# Patient Record
Sex: Female | Born: 1955 | Race: White | Hispanic: No | Marital: Married | Smoking: Never smoker
Health system: Southern US, Community
[De-identification: ages and names within clinical notes are randomized; demographics above are authoritative.]

## PROBLEM LIST (undated history)

## (undated) DIAGNOSIS — N809 Endometriosis, unspecified: Secondary | ICD-10-CM

## (undated) DIAGNOSIS — D569 Thalassemia, unspecified: Secondary | ICD-10-CM

## (undated) DIAGNOSIS — K589 Irritable bowel syndrome without diarrhea: Secondary | ICD-10-CM

## (undated) DIAGNOSIS — M797 Fibromyalgia: Secondary | ICD-10-CM

## (undated) DIAGNOSIS — G9050 Complex regional pain syndrome I, unspecified: Secondary | ICD-10-CM

## (undated) HISTORY — DX: Complex regional pain syndrome I, unspecified: G90.50

## (undated) HISTORY — DX: Irritable bowel syndrome, unspecified: K58.9

## (undated) HISTORY — PX: OTHER SURGICAL HISTORY: SHX169

## (undated) HISTORY — PX: VAGINAL HYSTERECTOMY: SUR661

## (undated) HISTORY — PX: CHOLECYSTECTOMY: SHX55

## (undated) HISTORY — DX: Endometriosis, unspecified: N80.9

## (undated) HISTORY — DX: Thalassemia, unspecified: D56.9

## (undated) HISTORY — DX: Fibromyalgia: M79.7

---

## 2004-07-19 ENCOUNTER — Ambulatory Visit: Payer: Self-pay | Admitting: Pain Medicine

## 2005-01-05 ENCOUNTER — Ambulatory Visit: Payer: Self-pay | Admitting: Pain Medicine

## 2005-02-24 ENCOUNTER — Ambulatory Visit: Payer: Self-pay | Admitting: Pain Medicine

## 2005-03-15 ENCOUNTER — Ambulatory Visit: Payer: Self-pay | Admitting: Physician Assistant

## 2005-04-07 ENCOUNTER — Ambulatory Visit: Payer: Self-pay | Admitting: Pain Medicine

## 2005-04-20 ENCOUNTER — Ambulatory Visit: Payer: Self-pay | Admitting: Physician Assistant

## 2005-05-04 ENCOUNTER — Ambulatory Visit: Payer: Self-pay | Admitting: Physician Assistant

## 2011-11-22 DIAGNOSIS — D563 Thalassemia minor: Secondary | ICD-10-CM | POA: Insufficient documentation

## 2013-01-09 DIAGNOSIS — Z803 Family history of malignant neoplasm of breast: Secondary | ICD-10-CM | POA: Insufficient documentation

## 2014-05-15 DIAGNOSIS — M797 Fibromyalgia: Secondary | ICD-10-CM | POA: Insufficient documentation

## 2014-05-15 DIAGNOSIS — K219 Gastro-esophageal reflux disease without esophagitis: Secondary | ICD-10-CM | POA: Insufficient documentation

## 2014-05-15 DIAGNOSIS — M8580 Other specified disorders of bone density and structure, unspecified site: Secondary | ICD-10-CM | POA: Insufficient documentation

## 2014-05-15 DIAGNOSIS — N6019 Diffuse cystic mastopathy of unspecified breast: Secondary | ICD-10-CM | POA: Insufficient documentation

## 2015-02-13 ENCOUNTER — Encounter: Payer: Self-pay | Admitting: Internal Medicine

## 2015-04-16 ENCOUNTER — Encounter: Payer: Self-pay | Admitting: Internal Medicine

## 2015-04-16 ENCOUNTER — Ambulatory Visit (INDEPENDENT_AMBULATORY_CARE_PROVIDER_SITE_OTHER): Payer: BLUE CROSS/BLUE SHIELD | Admitting: Internal Medicine

## 2015-04-16 VITALS — BP 120/72 | HR 77 | Ht 62.0 in | Wt 108.0 lb

## 2015-04-16 DIAGNOSIS — K2100 Gastro-esophageal reflux disease with esophagitis, without bleeding: Secondary | ICD-10-CM

## 2015-04-16 DIAGNOSIS — R1031 Right lower quadrant pain: Secondary | ICD-10-CM

## 2015-04-16 DIAGNOSIS — D509 Iron deficiency anemia, unspecified: Secondary | ICD-10-CM | POA: Diagnosis not present

## 2015-04-16 DIAGNOSIS — K589 Irritable bowel syndrome without diarrhea: Secondary | ICD-10-CM

## 2015-04-16 DIAGNOSIS — R1011 Right upper quadrant pain: Secondary | ICD-10-CM

## 2015-04-16 MED ORDER — NA SULFATE-K SULFATE-MG SULF 17.5-3.13-1.6 GM/177ML PO SOLN: 1.0000 | Freq: Once | ORAL | Status: DC

## 2015-04-16 MED ORDER — METOCLOPRAMIDE HCL 10 MG PO TABS: ORAL_TABLET | ORAL | Status: DC

## 2015-04-16 NOTE — Patient Instructions (Addendum)
We have sent the following medications to your pharmacy for you to pick up at your convenience:  Reglan    You have been scheduled for an endoscopy and colonoscopy. Please follow the written instructions given to you at your visit today. Please pick up your prep supplies at the pharmacy within the next 1-3 days. If you use inhalers (even only as needed), please bring them with you on the day of your procedure.   You may take Prilosec  - over the counter - one tablet before your first meal of the day

## 2015-04-16 NOTE — Progress Notes (Signed)
HISTORY OF PRESENT ILLNESS:  Renee Richmond is a 60 y.o. female who is referred today by her primary care physician Dr. Georgiann Richmond for evaluation of indigestion, abdominal pain, and worsening microscopic anemia. 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 She also has a history of fibromyalgia, irritable bowel syndrome, and endometriosis. She is status post remote cholecystectomy and vaginal hysterectomy. Her chief complaint today is that of 6 months of epigastric pain with indigestion for which she takes Tums on a regular basis. She is reluctant to take prescription medications for GERD, though has not tried them in earnest. She states "I just like to take medication". Along with regurgitation and pyrosis she mentions belching and bloatedness. Occasionally he will develop some chest discomfort with her reflux. No dysphagia. She also reports new onset right upper quadrant discomfort of several months. The discomfort is described as a strong deep pain or soreness. This has been going on for about 3 months. No exacerbating or relieving factors. She does have chronic alternating bowel habits. No bleeding. She tells me that she saw her hematologist recently and it was noted that her anemia and microcytosis was slightly worse than baseline. Her hematologist recommended GI evaluation and was aware of previous evaluations. The patient did bring multiple outside records, which after considerable time, I reviewed. Previous gastroenterologist was Dr. Aleene Richmond in [place]. 2013 she was evaluated for nausea, indigestion, and epigastric pain. EGD was performed and was said to show "distal esophageal edema; less than ideal gastric peristalsis". She was told continue Prilosec and add ranitidine at bedtime. Also, colonoscopy performed June 2013 for routine screening purposes was said to be normal throughout with complete examination good  preparation. Follow-up in 10 years recommended review of outside blood work from November 2016 reveals a hemoglobin of 10.2 with MCV 62.2. Normal comprehensive metabolic panel hemoglobin in May 2016 was 11 with similar MCV. Iron B12 and folate as well as ferritin in May 2016 were normal. Finally, CT scan of the abdomen with contrast performed June 2016 was unremarkable postcholecystectomy. Finally, the patient states that she has significant problems with nausea and vomiting with preparations such as colonoscopy preps or CT.  REVIEW OF SYSTEMS:  All non-GI ROS negative except for occasional irregular heartbeat  Past Medical History  Diagnosis Date  . Fibromyalgia   . Thalassanemia   . IBS (irritable bowel syndrome)   . Endometriosis   . RSD (reflex sympathetic dystrophy)     left hand    Past Surgical History  Procedure Laterality Date  . Vaginal hysterectomy      25 years ago  . Cholecystectomy      36 years ago  . Right rotator cuff repair      Social History Renee Richmond  reports that she has never smoked. She does not have any smokeless tobacco history on file. She reports that she does not drink alcohol or use illicit drugs.  family history includes Breast cancer in her mother; Colon cancer in her maternal grandmother; Liver cancer in her maternal grandmother; Prostate cancer in her father.  Not on File     PHYSICAL EXAMINATION: Vital signs: BP 120/72 mmHg  Pulse 77  Ht  (1.575 m)  Wt 108 lb (48.988 kg)  BMI 19.75 kg/m2  Constitutional: Pleasant, thin, generally well-appearing, no acute distress Psychiatric: alert and oriented x3, cooperative Eyes: extraocular movements intact, anicteric, conjunctiva pink Mouth: oral pharynx moist, no  lesions Neck: supple without thyromegaly Lymph: no lymphadenopathy Cardiovascular: heart regular rate and rhythm, no murmur Lungs: clear to auscultation bilaterally Abdomen: soft, nontender, nondistended, no obvious ascites,  no peritoneal signs, normal bowel sounds, no organomegaly. Reproducible right upper quadrant tenderness with palpation along the large cholecystectomy incision Rectal: Deferred until colonoscopy Extremities: no clubbing cyanosis or lower extremity edema bilaterally Skin: no lesions on visible extremities Neuro: No focal deficits. Brisk DTRs  ASSESSMENT:  #1. GERD. Not controlled with antacids on demand #2. Epigastric pain. Likely secondary to GERD #3. Right upper quadrant pain. Musculoskeletal in nature as confirmed on physical exam #4. Worsening microcytic anemia. Has a history of thalassemia minor. Likely due to the same. Hematologist concerned about potential GI blood loss. No documented iron deficiency. The patient is anxious regarding this issue which she wishes to have evaluated endoscopically #5. Colonoscopy 2013 normal #6. Difficulties with preparations on prior colonoscopy 7. IBS   PLAN:  #1. Reflux precautions. Reviewed. Literature provided #2.  I recommended omeprazole OTC 20 mg daily. I asked her to try for 2 weeks to see if this helps. We can go from there... #3. Schedule upper endoscopy to evaluate epigastric pain (rule out other causes such as PUD) and obtain duodenal biopsies to exclude celiac disease for worsening microcytic anemia and IBS-type complaints.The nature of the procedure, as well as the risks, benefits, and alternatives were carefully and thoroughly reviewed with the patient. Ample time for discussion and questions allowed. The patient understood, was satisfied, and agreed to proceed. #4. Schedule colonoscopy based on concerns raised by hematologist [facility]. I suspect this is low yield UNLESS iron deficiency identified during her [facility] evaluation. I do not have [facility] records.The nature of the procedure, as well as the risks, benefits, and alternatives were carefully and thoroughly reviewed with the patient. Ample time for discussion and questions allowed. The patient  understood, was satisfied, and agreed to proceed. #5. Adjustment in prep. Suprep. Hard candy with prep recommended. Also, prescribed metoclopramide 10 mg before each prep session #6. Discussion on right upper quadrant pain. Musculoskeletal etiology. Reassurance  A copy of this consultation and has been sent to Dr. Jonelle Sports

## 2015-04-22 ENCOUNTER — Telehealth: Payer: Self-pay

## 2015-04-22 NOTE — Telephone Encounter (Signed)
Patient to called to get clarification on her medicine.  The sheet that came from the pharmacy said that Reglan can be used to treat Renee Richmond so she wanted to make sure it was the right one.  I told her that Reglan is also used for nausea and that she had the correct medication.  Patient acknowledged and understood

## 2015-04-28 ENCOUNTER — Ambulatory Visit (AMBULATORY_SURGERY_CENTER): Payer: BLUE CROSS/BLUE SHIELD | Admitting: Internal Medicine

## 2015-04-28 ENCOUNTER — Encounter: Payer: Self-pay | Admitting: Internal Medicine

## 2015-04-28 VITALS — BP 134/74 | HR 78 | Temp 95.4°F | Resp 17 | Ht 62.0 in | Wt 108.0 lb

## 2015-04-28 DIAGNOSIS — R1011 Right upper quadrant pain: Secondary | ICD-10-CM | POA: Diagnosis not present

## 2015-04-28 DIAGNOSIS — K589 Irritable bowel syndrome without diarrhea: Secondary | ICD-10-CM | POA: Diagnosis not present

## 2015-04-28 DIAGNOSIS — D509 Iron deficiency anemia, unspecified: Secondary | ICD-10-CM | POA: Diagnosis not present

## 2015-04-28 DIAGNOSIS — K2100 Gastro-esophageal reflux disease with esophagitis, without bleeding: Secondary | ICD-10-CM

## 2015-04-28 MED ORDER — SODIUM CHLORIDE 0.9 % IV SOLN: 500.0000 mL | INTRAVENOUS | Status: DC

## 2015-04-28 NOTE — Op Note (Signed)
[facility] [address] [place], [address]   ENDOSCOPY PROCEDURE REPORT  PATIENT: [name], [name]  MR#: [id number] BIRTHDATE: 02/12/1956 , 59  yrs. old GENDER: female ENDOSCOPIST: Roxy Cedar, MD REFERRED BY:  Eldridge Abrahams, [name] PROCEDURE DATE:  04/28/2015 PROCEDURE:  EGD w/ biopsy ASA CLASS:     Class II INDICATIONS:  abdominal pain and history of esophageal reflux. MEDICATIONS: Monitored anesthesia care and Propofol 100 mg IV TOPICAL ANESTHETIC: none  DESCRIPTION OF PROCEDURE: After the risks benefits and alternatives of the procedure were thoroughly explained, informed consent was obtained.  The LB [id number] endoscope was introduced through the mouth and advanced to the second portion of the duodenum , Without limitations.  The instrument was slowly withdrawn as the mucosa was fully examined.  EXAM:The esophagus revealed a large-caliber ringlike distal esophageal stricture at the gastroesophageal junction without significant inflammation or Barrett's esophagus.stomach was normal. The duodenum was normal.  Duodenal biopsies obtained to rule out sprue.  Retroflexed views revealed a hiatal hernia.     The scope was then withdrawn from the patient and the procedure completed.  COMPLICATIONS: There were no immediate complications.  ENDOSCOPIC IMPRESSION: 1. Large caliber distal esophageal stricture. Otherwise normal EGD 2. GERD 3. Microcytic anemia. Known thalassemia  RECOMMENDATIONS: 1.  Anti-reflux regimen to be followed 2.  Continue PPI (Prilosec OTC 20 mg daily) to help control your acid reflux symptoms 3.  Await biopsy results . Dr. Marina Goodell will send you a letter 4. Return to the care of your primary physician  REPEAT EXAM:  eSigned:  Roxy Cedar, MD 04/28/2015 2:50 PM    CC:The Patient  ; [name], [name]

## 2015-04-28 NOTE — Patient Instructions (Signed)
HANDOUTS GIVEN : GERD AND DIVERTICULOSIS.   YOU HAD AN ENDOSCOPIC PROCEDURE TODAY AT THE [facility]:   Refer to the procedure report that was given to you for any specific questions about what was found during the examination.  If the procedure report does not answer your questions, please call your gastroenterologist to clarify.  If you requested that your care partner not be given the details of your procedure findings, then the procedure report has been included in a sealed envelope for you to review at your convenience later.  YOU SHOULD EXPECT: Some feelings of bloating in the abdomen. Passage of more gas than usual.  Walking can help get rid of the air that was put into your GI tract during the procedure and reduce the bloating. If you had a lower endoscopy (such as a colonoscopy or flexible sigmoidoscopy) you may notice spotting of blood in your stool or on the toilet paper. If you underwent a bowel prep for your procedure, you may not have a normal bowel movement for a few days.  Please Note:  You might notice some irritation and congestion in your nose or some drainage.  This is from the oxygen used during your procedure.  There is no need for concern and it should clear up in a day or so.  SYMPTOMS TO REPORT IMMEDIATELY:   Following lower endoscopy (colonoscopy or flexible sigmoidoscopy):  Excessive amounts of blood in the stool  Significant tenderness or worsening of abdominal pains  Swelling of the abdomen that is new, acute  Fever of 100F or higher   Following upper endoscopy (EGD)  Vomiting of blood or coffee ground material  New chest pain or pain under the shoulder blades  Painful or persistently difficult swallowing  New shortness of breath  Fever of 100F or higher  Black, tarry-looking stools  For urgent or emergent issues, a gastroenterologist can be reached at any hour by calling [phone].   DIET: Your first meal following the procedure should  be a small meal and then it is ok to progress to your normal diet. Heavy or fried foods are harder to digest and may make you feel nauseous or bloated.  Likewise, meals heavy in dairy and vegetables can increase bloating.  Drink plenty of fluids but you should avoid alcoholic beverages for 24 hours.  ACTIVITY:  You should plan to take it easy for the rest of today and you should NOT DRIVE or use heavy machinery until tomorrow (because of the sedation medicines used during the test).    FOLLOW UP: Our staff will call the number listed on your records the next business day following your procedure to check on you and address any questions or concerns that you may have regarding the information given to you following your procedure. If we do not reach you, we will leave a message.  However, if you are feeling well and you are not experiencing any problems, there is no need to return our call.  We will assume that you have returned to your regular daily activities without incident.  If any biopsies were taken you will be contacted by phone or by letter within the next 1-3 weeks.  Please call us at [phone] if you have not heard about the biopsies in 3 weeks.    SIGNATURES/CONFIDENTIALITY: You and/or your care partner have signed paperwork which will be entered into your electronic medical record.  These signatures attest to the fact that that the information above  on your After Visit Summary has been reviewed and is understood.  Full responsibility of the confidentiality of this discharge information lies with you and/or your care-partner.

## 2015-04-28 NOTE — Progress Notes (Signed)
Called to room to assist during endoscopic procedure.  Patient ID and intended procedure confirmed with present staff. Received instructions for my participation in the procedure from the performing physician.  

## 2015-04-28 NOTE — Progress Notes (Signed)
To recovery, report to Tyrell, RN, VSS. 

## 2015-04-28 NOTE — Op Note (Signed)
[facility] [address] [place], [address]   COLONOSCOPY PROCEDURE REPORT  PATIENT: Renee Richmond, Renee Richmond  MR#: [id number] BIRTHDATE: 1956-01-27 , 59  yrs. old GENDER: female ENDOSCOPIST: Roxy Cedar, MD REFERRED AO:[id number] Emelda Brothers, [name] PROCEDURE DATE:  04/28/2015 PROCEDURE:   Colonoscopy, diagnostic First Screening Colonoscopy - Avg.  risk and is 50 yrs.  old or older - No.  Prior Negative Screening - Now for repeat screening. N/A  History of Adenoma - Now for follow-up colonoscopy & has been > or = to 3 yrs.  N/A  Polyps removed today? No Recommend repeat exam, <10 yrs? No ASA CLASS:   Class II INDICATIONS:microcytic anemia, worsening from baseline (known thalassemia minor). Colonoscopy elsewhere 2013 was negative. MEDICATIONS: Monitored anesthesia care and Propofol 200 mg IV  DESCRIPTION OF PROCEDURE:   After the risks benefits and alternatives of the procedure were thoroughly explained, informed consent was obtained.  The digital rectal exam revealed no abnormalities of the rectum.   The LB [id number]  endoscope was introduced through the anus and advanced to the cecum, which was identified by both the appendix and ileocecal valve. No adverse events experienced.   The quality of the prep was excellent. (Suprep was used)  The instrument was then slowly withdrawn as the colon was fully examined. Estimated blood loss is zero unless otherwise noted in this procedure report.      COLON FINDINGS: There was mild diverticulosis noted in the sigmoid colon.   The examination was otherwise normal.  Retroflexed views revealed no abnormalities. The time to cecum = 3.3 Withdrawal time = 8.3   The scope was withdrawn and the procedure completed. COMPLICATIONS: There were no immediate complications.  ENDOSCOPIC IMPRESSION: 1.   Mild diverticulosis was noted in the sigmoid colon 2.   The examination was otherwise normal  RECOMMENDATIONS: 1.  Continue  current colorectal screening recommendations for "routine risk" patients with a repeat colonoscopy in 10 years. 2.  Upper endoscopy today (please see report)  eSigned:  Roxy Cedar, MD 04/28/2015 2:45 PM   cc: The Patient    ;Reuel Boom Pomposini,MD

## 2015-04-29 ENCOUNTER — Telehealth: Payer: Self-pay | Admitting: Emergency Medicine

## 2015-04-29 NOTE — Telephone Encounter (Signed)
  Follow up Call-  Call back number 04/28/2015  Post procedure Call Back phone  # [phone]  Permission to leave phone message Yes     Patient questions:  Do you have a fever, pain , or abdominal swelling? No. Pain Score  0 *  Have you tolerated food without any problems? Yes.    Have you been able to return to your normal activities? Yes.    Do you have any questions about your discharge instructions: Diet   No. Medications  No. Follow up visit  No.  Do you have questions or concerns about your Care? No.  Actions: * If pain score is 4 or above: No action needed, pain <4.

## 2015-05-05 ENCOUNTER — Encounter: Payer: Self-pay | Admitting: Internal Medicine

## 2019-04-15 ENCOUNTER — Encounter (INDEPENDENT_AMBULATORY_CARE_PROVIDER_SITE_OTHER): Payer: Self-pay

## 2019-04-15 ENCOUNTER — Ambulatory Visit (INDEPENDENT_AMBULATORY_CARE_PROVIDER_SITE_OTHER): Payer: BC Managed Care – PPO | Admitting: Internal Medicine

## 2019-04-15 ENCOUNTER — Other Ambulatory Visit: Payer: Self-pay

## 2019-04-15 ENCOUNTER — Other Ambulatory Visit: Payer: Self-pay | Admitting: Orthopedic Surgery

## 2019-04-15 ENCOUNTER — Encounter: Payer: Self-pay | Admitting: Internal Medicine

## 2019-04-15 VITALS — BP 124/66 | HR 64 | Temp 98.5°F | Ht 63.0 in | Wt 109.0 lb

## 2019-04-15 DIAGNOSIS — M797 Fibromyalgia: Secondary | ICD-10-CM

## 2019-04-15 DIAGNOSIS — R1011 Right upper quadrant pain: Secondary | ICD-10-CM | POA: Diagnosis not present

## 2019-04-15 DIAGNOSIS — K219 Gastro-esophageal reflux disease without esophagitis: Secondary | ICD-10-CM | POA: Diagnosis not present

## 2019-04-15 DIAGNOSIS — K222 Esophageal obstruction: Secondary | ICD-10-CM | POA: Diagnosis not present

## 2019-04-15 DIAGNOSIS — R1013 Epigastric pain: Secondary | ICD-10-CM | POA: Diagnosis not present

## 2019-04-15 DIAGNOSIS — K5730 Diverticulosis of large intestine without perforation or abscess without bleeding: Secondary | ICD-10-CM

## 2019-04-15 DIAGNOSIS — M25511 Pain in right shoulder: Secondary | ICD-10-CM

## 2019-04-15 MED ORDER — SUCRALFATE 1 GM/10ML PO SUSP: 1.0000 g | Freq: Two times a day (BID) | ORAL | 3 refills | Status: DC

## 2019-04-15 NOTE — Patient Instructions (Signed)
If you are age 66 or older, your body mass index should be between 23-30. Your Body mass index is 19.31 kg/m. If this is out of the aforementioned range listed, please consider follow up with your Primary Care Provider.  If you are age 108 or younger, your body mass index should be between 19-25. Your Body mass index is 19.31 kg/m. If this is out of the aformentioned range listed, please consider follow up with your Primary Care Provider.    We have sent the following medications to your pharmacy for you to pick up at your convenience:  Carafate  You have been scheduled for an abdominal ultrasound at [facility] (1st floor of hospital) on 04/18/2019 at 9:00am. Please arrive 15 minutes prior to your appointment for registration. Make certain not to have anything to eat or drink after midnight prior to your appointment. Should you need to reschedule your appointment, please contact radiology at [phone]. This test typically takes about 30 minutes to perform.

## 2019-04-15 NOTE — Progress Notes (Signed)
HISTORY OF PRESENT ILLNESS:  Renee Richmond is a 64 y.o. female, from [place], who was initially evaluated in this office April 16, 2015 with a myriad of complaints.  See that dictation for details.  She subsequently underwent colonoscopy and upper endoscopy April 28, 2015.  Colonoscopy revealed mild sigmoid diverticulosis but was otherwise normal.  Upper endoscopy revealed an incidental large caliber esophageal stricture but was otherwise normal.  Her history is remarkable for irritable bowel syndrome, GERD, and fibromyalgia.  She is status post remote cholecystectomy.  For her GERD she continues on omeprazole 20 mg daily.  For the most part this controls reflux symptoms.  Her chief complaint today is that of epigastric discomfort or fullness.  She states that "it feels like something is there".  As well, right upper quadrant pain.  Similar to which she had 4 years ago.  She feels that the symptoms are worse after having had Covid last November.  She also complains of increased gas and flatus.  Her bowel habits continue to alternate.  No bleeding.  No weight loss.  No fevers.  REVIEW OF SYSTEMS:  All non-GI ROS negative unless otherwise stated in the HPI except for irregular heartbeat  Past Medical History:  Diagnosis Date  . Endometriosis   . Fibromyalgia   . IBS (irritable bowel syndrome)   . RSD (reflex sympathetic dystrophy)    left hand  . Thalassanemia     Past Surgical History:  Procedure Laterality Date  . CHOLECYSTECTOMY     36 years ago  . right rotator cuff repair    . VAGINAL HYSTERECTOMY     25 years ago    Social History [name]  reports that she has never smoked. She has never used smokeless tobacco. She reports that she does not drink alcohol or use drugs.  family history includes Breast cancer in her mother; Colon cancer in her maternal grandmother; Liver cancer in her maternal grandmother; Prostate cancer in her father.  Allergies  Allergen  Reactions  . Codeine Nausea And Vomiting  . Darvon  [Propoxyphene] Nausea And Vomiting    DARVOCET. DARVOCET       PHYSICAL EXAMINATION: Vital signs: BP 124/66   Pulse 64   Temp 98.5 F (36.9 C)   Ht 5' 3"  (1.6 m)   Wt 109 lb (49.4 kg)   BMI 19.31 kg/m   Constitutional: generally well-appearing, no acute distress Psychiatric: alert and oriented x3, cooperative Eyes: extraocular movements intact, anicteric, conjunctiva pink Mouth: oral pharynx moist, no lesions Neck: supple no lymphadenopathy Cardiovascular: heart regular rate and rhythm, no murmur Lungs: clear to auscultation bilaterally Abdomen: soft, nontender, nondistended, no obvious ascites, no peritoneal signs, normal bowel sounds, no organomegaly Rectal: Omitted Extremities: no clubbing, cyanosis, or lower extremity edema bilaterally Skin: no lesions on visible extremities Neuro: No focal deficits.  Cranial nerves intact  ASSESSMENT:  1.  Vague epigastric discomfort.  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  GERD.  On PPI.  No classic symptoms 3.  Right upper quadrant pain.  Chronic and recurrent.  Muscle wall tenderness on exam 4.  Fibromyalgia 5.  Prior colonoscopy and upper endoscopy 2017 as described 6.  Status post cholecystectomy remotely   PLAN:  1.  Reflux precautions 2.  Continue omeprazole 3.  Prescribe Carafate slurry twice daily.  Medication side effects reviewed 4.  Schedule abdominal ultrasound to evaluate epigastric discomfort.  We will contact the patient with results when available 5.  Follow-up screening colonoscopy around 2027 6.  Routine GI follow-up as needed A total time of 45 minutes was spent preparing to see the patient, reviewing relevant data, obtaining history, performing comprehensive physical exam, counseling the patient regarding her above listed issues, ordering medications and advanced radiology examination, and documenting the clinical information in the health record

## 2019-04-18 ENCOUNTER — Other Ambulatory Visit: Payer: Self-pay

## 2019-04-18 ENCOUNTER — Ambulatory Visit (HOSPITAL_COMMUNITY)
Admission: RE | Admit: 2019-04-18 | Discharge: 2019-04-18 | Disposition: A | Discharge status: Home / Self Care | Payer: BC Managed Care – PPO | Source: Ambulatory Visit | Attending: Internal Medicine | Admitting: Internal Medicine

## 2019-04-18 DIAGNOSIS — R1013 Epigastric pain: Secondary | ICD-10-CM | POA: Insufficient documentation

## 2019-04-18 DIAGNOSIS — R1011 Right upper quadrant pain: Secondary | ICD-10-CM | POA: Insufficient documentation

## 2019-05-04 ENCOUNTER — Ambulatory Visit
Admission: RE | Admit: 2019-05-04 | Discharge: 2019-05-04 | Disposition: A | Discharge status: Home / Self Care | Payer: BC Managed Care – PPO | Source: Ambulatory Visit | Attending: Orthopedic Surgery | Admitting: Orthopedic Surgery

## 2019-05-04 DIAGNOSIS — M25511 Pain in right shoulder: Secondary | ICD-10-CM

## 2019-06-18 ENCOUNTER — Telehealth: Payer: Self-pay | Admitting: Internal Medicine

## 2019-06-24 ENCOUNTER — Telehealth: Payer: Self-pay

## 2019-06-24 MED ORDER — SUCRALFATE 1 GM/10ML PO SUSP: 1.0000 g | Freq: Two times a day (BID) | ORAL | 3 refills | Status: DC

## 2019-06-24 NOTE — Telephone Encounter (Signed)
Sucralfate approved by [employer] through 06/18/2020.

## 2019-06-24 NOTE — Telephone Encounter (Signed)
Prior authorization for [employer] approved through Cover My  Meds

## 2019-07-22 DIAGNOSIS — M7989 Other specified soft tissue disorders: Secondary | ICD-10-CM | POA: Insufficient documentation

## 2019-07-22 DIAGNOSIS — L942 Calcinosis cutis: Secondary | ICD-10-CM | POA: Insufficient documentation

## 2020-01-27 ENCOUNTER — Telehealth: Payer: Self-pay | Admitting: Internal Medicine

## 2020-01-27 NOTE — Telephone Encounter (Signed)
Patient called states she is having upper  abdominal issues and is hard for to eat is seeking advise

## 2020-01-27 NOTE — Telephone Encounter (Signed)
Pt states that the upper part of her stomach has been "giving her a fit."Reports she has been having abdominal pain along with diarrhea and nausea. Pt requesting to be seen. Pt scheduled to see Hyacinth Meeker PA 01/30/20 at 8:30am. Pt aware of appt.

## 2020-01-30 ENCOUNTER — Encounter: Payer: Self-pay | Admitting: Physician Assistant

## 2020-01-30 ENCOUNTER — Ambulatory Visit (INDEPENDENT_AMBULATORY_CARE_PROVIDER_SITE_OTHER): Payer: Medicare Other | Admitting: Physician Assistant

## 2020-01-30 VITALS — BP 114/58 | HR 72 | Ht 63.0 in | Wt 108.1 lb

## 2020-01-30 DIAGNOSIS — R1013 Epigastric pain: Secondary | ICD-10-CM

## 2020-01-30 DIAGNOSIS — R110 Nausea: Secondary | ICD-10-CM | POA: Diagnosis not present

## 2020-01-30 DIAGNOSIS — K219 Gastro-esophageal reflux disease without esophagitis: Secondary | ICD-10-CM | POA: Diagnosis not present

## 2020-01-30 MED ORDER — PANTOPRAZOLE SODIUM 40 MG PO TBEC: 40.0000 mg | DELAYED_RELEASE_TABLET | Freq: Two times a day (BID) | ORAL | 3 refills | Status: DC

## 2020-01-30 NOTE — Progress Notes (Signed)
Noted  

## 2020-01-30 NOTE — Progress Notes (Signed)
Chief Complaint: Upper abdominal pain  HPI:    Renee Richmond is a 64 year old female with past medical history as listed below including IBS and fibromyalgia known to Dr. Henrene Pastor, who was referred to me by Dairl Ponder, MD for a complaint of upper abdominal pain.      04/28/2015 EGD with large caliber distal esophageal stricture, GERD.  Same-day colonoscopy with mild sigmoid diverticulosis and otherwise normal.  Repeat recommended in 10 years.    04/15/2019 patient seen in clinic by Dr. Henrene Pastor for a myriad of complaints.  She was complaining of epigastric discomfort and fullness at that time.  She was continued on omeprazole and given Carafate slurry twice daily also ordered abdominal ultrasound as below.    04/18/2019 ultrasound of the abdomen with status post cholecystectomy, no biliary ductal dilation, no acute findings, aortic atherosclerosis.    01/27/2020 patient seen in the ED for epigastric pain.  Abdominal x-ray showed nonobstructive bowel gas pattern no acute cardiopulmonary process.  Labs showed hemoglobin minimally decreased at 11.2, MCV low at 66 (known thalassemia), potassium minimally decreased at 3.4 otherwise normal CBC and CMP.    Today, the patient describes that she has been to the ER twice for this epigastric pain.  Tells me that she has really battled with it ever since she had Covid in November of last year, in fact that is why she was seen in clinic back in February.  Tells me that it has worsened over the past month and especially in the past 2 weeks and she has a new symptom of nausea but no vomiting.  Tells me the nausea is so bad with the epigastric pain that she did not want to eat much of anything and has lost several pounds over the past week or so.  This all got worse on Wednesday of last week, she proceeded to the ER on 2 separate occasions, the last as above.  Initially they changed her Omeprazole 20 mg to Pantoprazole 40 mg and she also restarted her Carafate which she  typically takes twice a day.  She was also using Zofran about every 6 hours until Tuesday of this week, 01/28/2020, she did not require it at all yesterday.  Symptoms seem to be slowly getting better in the past 48 hours, but patient tells me that she has a chronic cough and clears her throat a lot and does not feel like her reflux is under good control.  She would like to know what is going on.    Tells me she did start a new medication for heart palpitations, Metoprolol about 3 weeks ago, but she has discussed this with her cardiologist who does not think it is related to her nausea.  She has had both of her Covid vaccines.    Denies fever, chills or blood in her stool.  Past Medical History:  Diagnosis Date  . Endometriosis   . Fibromyalgia   . IBS (irritable bowel syndrome)   . RSD (reflex sympathetic dystrophy)    left hand  . Thalassanemia     Past Surgical History:  Procedure Laterality Date  . CHOLECYSTECTOMY     36 years ago  . right rotator cuff repair    . VAGINAL HYSTERECTOMY     25 years ago    Current Outpatient Medications  Medication Sig Dispense Refill  . Cholecalciferol (VITAMIN D3) 50 MCG (2000 UT) capsule Take 2,000 Units by mouth daily.    Marland Kitchen estrogens, conjugated, (PREMARIN) 0.45 MG tablet  Take 0.45 mg by mouth daily. Take daily for 21 days then do not take for 7 days.    . folic acid (FOLVITE) 1 MG tablet Take 1 mg by mouth daily.    Marland Kitchen Lifitegrast (XIIDRA) 5 % SOLN Apply 1 drop to eye 2 (two) times daily.    . metoCLOPramide (REGLAN) 10 MG tablet Take one tablet by mouth 20 minutes before drinking the first half of the prep.  Take one tablet by mouth 20 minutes before drinking the second half of the prep 2 tablet 0  . omeprazole (PRILOSEC) 20 MG capsule Take 20 mg by mouth daily.    . sucralfate (CARAFATE) 1 GM/10ML suspension Take 10 mLs (1 g total) by mouth 2 (two) times daily. Approved through cover my meds from 05/13/2019 to 06/18/2020 420 mL 3   No current  facility-administered medications for this visit.    Allergies as of 01/30/2020 - Review Complete 04/15/2019  Allergen Reaction Noted  . Codeine Nausea And Vomiting 04/28/2015  . Darvon  [propoxyphene] Nausea And Vomiting 04/28/2015    Family History  Problem Relation Age of Onset  . Breast cancer Mother   . Prostate cancer Father   . Liver cancer Maternal Grandmother   . Colon cancer Maternal Grandmother     Social History   Socioeconomic History  . Marital status: Unknown    Spouse name: Not on file  . Number of children: Not on file  . Years of education: Not on file  . Highest education level: Not on file  Occupational History  . Not on file  Tobacco Use  . Smoking status: Never Smoker  . Smokeless tobacco: Never Used  Vaping Use  . Vaping Use: Never used  Substance and Sexual Activity  . Alcohol use: No    Alcohol/week: 0.0 standard drinks  . Drug use: No  . Sexual activity: Not on file  Other Topics Concern  . Not on file  Social History Narrative  . Not on file   Social Determinants of Health   Financial Resource Strain:   . Difficulty of Paying Living Expenses: Not on file  Food Insecurity:   . Worried About Charity fundraiser in the Last Year: Not on file  . Ran Out of Food in the Last Year: Not on file  Transportation Needs:   . Lack of Transportation (Medical): Not on file  . Lack of Transportation (Non-Medical): Not on file  Physical Activity:   . Days of Exercise per Week: Not on file  . Minutes of Exercise per Session: Not on file  Stress:   . Feeling of Stress : Not on file  Social Connections:   . Frequency of Communication with Friends and Family: Not on file  . Frequency of Social Gatherings with Friends and Family: Not on file  . Attends Religious Services: Not on file  . Active Member of Clubs or Organizations: Not on file  . Attends Archivist Meetings: Not on file  . Marital Status: Not on file  Intimate Partner  Violence:   . Fear of Current or Ex-Partner: Not on file  . Emotionally Abused: Not on file  . Physically Abused: Not on file  . Sexually Abused: Not on file    Review of Systems:    Constitutional: No weight loss, fever or chills Cardiovascular: No chest pain Respiratory: No SOB  Gastrointestinal: See HPI and otherwise negative   Physical Exam:  Vital signs: BP (!) 114/58 (BP Location:  Right Arm, Patient Position: Sitting, Cuff Size: Normal)   Pulse 72   Ht _0  (1.6 m)   Wt 108 lb 2 oz (49 kg)   BMI 19.15 kg/m   Constitutional:   Pleasant Caucasian female appears to be in NAD, Well developed, Well nourished, alert and cooperative Respiratory: Respirations even and unlabored. Lungs clear to auscultation bilaterally.   No wheezes, crackles, or rhonchi.  Cardiovascular: Normal S1, S2. No MRG. Regular rate and rhythm. No peripheral edema, cyanosis or pallor.  Gastrointestinal:  Soft, nondistended,mild epigastric ttp No rebound or guarding. Normal bowel sounds. No appreciable masses or hepatomegaly. Rectal:  Not performed.  Psychiatric: Demonstrates good judgement and reason without abnormal affect or behaviors.  See HPI for recent labs and imaging.  Assessment: 1.  Epigastric pain: For the past year since having Covid, but worse over the past couple of weeks, likely related to below +/-PUD or other 2.  GERD: Seen at time of last EGD in 2017, describes a chronic cough and throat clearing; likely worsened reflux 3.  Chronic cough 4.  Nausea  Plan: 1.  Scheduled patient for diagnostic EGD in the [place] with Dr. Henrene Pastor.  Patient was given information regarding risks for the procedure and agreed to proceed.  She has had both of her Covid vaccines. 2.  Increased patient's Pantoprazole to 40 mg twice daily, 30-60 minutes for breakfast and dinner #60 with 3 refills. 3.  Patient can continue Carafate and Zofran as needed 4.  Patient to follow in clinic per recommendations from Dr. Henrene Pastor  after time of procedure.  Ellouise Newer, PA-C [facility] 01/30/2020, 8:36 AM  Cc: Dairl Ponder, MD

## 2020-01-30 NOTE — Patient Instructions (Addendum)
If you are age 66 or older, your body mass index should be between 23-30. Your Body mass index is 19.15 kg/m. If this is out of the aforementioned range listed, please consider follow up with your Primary Care Provider.  If you are age 86 or younger, your body mass index should be between 19-25. Your Body mass index is 19.15 kg/m. If this is out of the aformentioned range listed, please consider follow up with your Primary Care Provider.   We have sent the following medications to your pharmacy for you to pick up at your convenience: Pantoprazole 40 mg 30-60 minutes before breakfast and dinner.   .You have been scheduled for an endoscopy. Please follow written instructions given to you at your visit today. If you use inhalers (even only as needed), please bring them with you on the day of your procedure.  Due to recent changes in healthcare laws, you may see the results of your imaging and laboratory studies on MyChart before your provider has had a chance to review them.  We understand that in some cases there may be results that are confusing or concerning to you. Not all laboratory results come back in the same time frame and the provider may be waiting for multiple results in order to interpret others.  Please give [place] 48 hours in order for your provider to thoroughly review all the results before contacting the office for clarification of your results.   Thank you for choosing me and [facility].  Hyacinth Meeker, PA-C

## 2020-02-03 ENCOUNTER — Encounter: Payer: Self-pay | Admitting: Internal Medicine

## 2020-02-03 ENCOUNTER — Ambulatory Visit (AMBULATORY_SURGERY_CENTER): Payer: Medicare Other | Admitting: Internal Medicine

## 2020-02-03 ENCOUNTER — Other Ambulatory Visit: Payer: Self-pay

## 2020-02-03 VITALS — BP 125/57 | HR 85 | Temp 97.1°F | Resp 13 | Ht 63.0 in | Wt 108.0 lb

## 2020-02-03 DIAGNOSIS — K2100 Gastro-esophageal reflux disease with esophagitis, without bleeding: Secondary | ICD-10-CM

## 2020-02-03 DIAGNOSIS — R1013 Epigastric pain: Secondary | ICD-10-CM

## 2020-02-03 MED ORDER — DICYCLOMINE HCL 10 MG PO CAPS: 10.0000 mg | ORAL_CAPSULE | Freq: Three times a day (TID) | ORAL | 3 refills | Status: DC

## 2020-02-03 MED ORDER — SODIUM CHLORIDE 0.9 % IV SOLN: 500.0000 mL | Freq: Once | INTRAVENOUS | Status: DC

## 2020-02-03 NOTE — Progress Notes (Signed)
PT taken to PACU. Monitors in place. VSS. Report given to RN. 

## 2020-02-03 NOTE — Patient Instructions (Signed)
Read all of the discharge handouts given to you  by your recovery room nurse.  Thank-you for choosing [place] for your healthcare needs today.  YOU HAD AN ENDOSCOPIC PROCEDURE TODAY AT THE [facility]:   Refer to the procedure report that was given to you for any specific questions about what was found during the examination.  If the procedure report does not answer your questions, please call your gastroenterologist to clarify.  If you requested that your care partner not be given the details of your procedure findings, then the procedure report has been included in a sealed envelope for you to review at your convenience later.  YOU SHOULD EXPECT: Some feelings of bloating in the abdomen. Passage of more gas than usual.  Walking can help get rid of the air that was put into your GI tract during the procedure and reduce the bloating.   Please Note:  You might notice some irritation and congestion in your nose or some drainage.  This is from the oxygen used during your procedure.  There is no need for concern and it should clear up in a day or so.  SYMPTOMS TO REPORT IMMEDIATELY:   Following upper endoscopy (EGD)  Vomiting of blood or coffee ground material  New chest pain or pain under the shoulder blades  Painful or persistently difficult swallowing  New shortness of breath  Fever of 100F or higher  Black, tarry-looking stools  For urgent or emergent issues, a gastroenterologist can be reached at any hour by calling [phone]. Do not use MyChart messaging for urgent concerns.    DIET:  We do recommend a small meal at first, but then you may proceed to your regular diet.  Drink plenty of fluids but you should avoid alcoholic beverages for 24 hours.  ACTIVITY:  You should plan to take it easy for the rest of today and you should NOT DRIVE or use heavy machinery until tomorrow (because of the sedation medicines used during the test).    FOLLOW UP: Our staff will call the  number listed on your records 48-72 hours following your procedure to check on you and address any questions or concerns that you may have regarding the information given to you following your procedure. If we do not reach you, we will leave a message.  We will attempt to reach you two times.  During this call, we will ask if you have developed any symptoms of COVID 19. If you develop any symptoms (ie: fever, flu-like symptoms, shortness of breath, cough etc.) before then, please call [phone].  If you test positive for Covid 19 in the 2 weeks post procedure, please call and report this information to [place].     SIGNATURES/CONFIDENTIALITY: You and/or your care partner have signed paperwork which will be entered into your electronic medical record.  These signatures attest to the fact that that the information above on your After Visit Summary has been reviewed and is understood.  Full responsibility of the confidentiality of this discharge information lies with you and/or your care-partner.

## 2020-02-03 NOTE — Progress Notes (Signed)
PT IV.

## 2020-02-03 NOTE — Op Note (Signed)
[facility] Patient Name: Renee Richmond Procedure Date: 02/03/2020 9:12 AM MRN: [id number] Endoscopist: Wilhemina Bonito. Marina Goodell , MD Age: 64 Referring MD:  Date of Birth: [birth year] Gender: Female Account #: [id number] Procedure:                Upper GI endoscopy Indications:              Epigastric abdominal pain Medicines:                Monitored Anesthesia Care Procedure:                Pre-Anesthesia Assessment:                           - Prior to the procedure, a History and Physical                            was performed, and patient medications and                            allergies were reviewed. The patient's tolerance of                            previous anesthesia was also reviewed. The risks                            and benefits of the procedure and the sedation                            options and risks were discussed with the patient.                            All questions were answered, and informed consent                            was obtained. Prior Anticoagulants: The patient has                            taken no previous anticoagulant or antiplatelet                            agents. ASA Grade Assessment: II - A patient with                            mild systemic disease. After reviewing the risks                            and benefits, the patient was deemed in                            satisfactory condition to undergo the procedure.                           After obtaining informed consent, the endoscope was  passed under direct vision. Throughout the                            procedure, the patient's blood pressure, pulse, and                            oxygen saturations were monitored continuously. The                            Endoscope was introduced through the mouth, and                            advanced to the second part of duodenum. The upper                            GI endoscopy was  accomplished without difficulty.                            The patient tolerated the procedure well. Scope In: Scope Out: Findings:                 The esophagus was normal.                           The stomach was normal.                           The examined duodenum was normal.                           The cardia and gastric fundus were normal on                            retroflexion. Complications:            No immediate complications. Estimated Blood Loss:     Estimated blood loss: none. Impression:               - Normal esophagus.                           - Normal stomach.                           - Normal examined duodenum.                           - No specimens collected. Recommendation:           - Patient has a contact number available for                            emergencies. The signs and symptoms of potential                            delayed complications were discussed with the  patient. Return to normal activities tomorrow.                            Written discharge instructions were provided to the                            patient.                           - Resume previous diet.                           - Continue present medications.                           -Prescribe Bentyl (dicyclomine) 20 mg; #30; 3                            refills; take 1 by mouth every 6 hours as needed                            for pain Zi Sek N. Marina Goodell, MD 02/03/2020 9:25:02 AM This report has been signed electronically.

## 2020-02-04 ENCOUNTER — Telehealth: Payer: Self-pay

## 2020-02-04 ENCOUNTER — Telehealth: Payer: Self-pay | Admitting: Internal Medicine

## 2020-02-04 MED ORDER — DICYCLOMINE HCL 20 MG PO TABS: 20.0000 mg | ORAL_TABLET | Freq: Three times a day (TID) | ORAL | 5 refills | Status: DC

## 2020-02-04 NOTE — Telephone Encounter (Signed)
Inbound call patient requesting Bentyl medication be changed to tablet for 20 mg.  States she use to take that and it worked better for her.

## 2020-02-04 NOTE — Telephone Encounter (Signed)
Sent script for Bentyl 20 mg QID

## 2020-02-04 NOTE — Telephone Encounter (Signed)
That is perfectly fine, thanks-JLL

## 2020-02-04 NOTE — Telephone Encounter (Signed)
  Follow up Call-  Call back number 02/03/2020  Post procedure Call Back phone  # [phone]  Permission to leave phone message Yes  Some recent data might be hidden     Patient questions:  Do you have a fever, pain , or abdominal swelling? No. Pain Score  2 *  Have you tolerated food without any problems? Yes.    Have you been able to return to your normal activities? Yes.    Do you have any questions about your discharge instructions: Diet   No. Medications  Yes.   Follow up visit  No.  Do you have questions or concerns about your Care? No.  Actions: * If pain score is 4 or above: No action needed, pain <4.  Pt states she took the bentyl that was prescribed on 02/03/2020, a 10 mg capsule and it "knocked her out". Asking to have the 20 mg tablet that worked better.  Phone note today shows new RX from Dr. Lamar Sprinkles office for med desired.  C/O slight chest discomfort.  1. Have you developed a fever since your procedure? no  2.   Have you had an respiratory symptoms (SOB or cough) since your procedure? no  3.   Have you tested positive for COVID 19 since your procedure no  4.   Have you had any family members/close contacts diagnosed with the COVID 19 since your procedure?  no   If yes to any of these questions please route to Laverna Peace, RN and Karlton Lemon, RN

## 2020-02-04 NOTE — Telephone Encounter (Signed)
Ok to send Bentyl 20 mg QID? Thank you.

## 2020-03-20 ENCOUNTER — Telehealth: Payer: Self-pay | Admitting: Physician Assistant

## 2020-03-20 ENCOUNTER — Other Ambulatory Visit: Payer: Self-pay | Admitting: Internal Medicine

## 2020-03-20 DIAGNOSIS — K2100 Gastro-esophageal reflux disease with esophagitis, without bleeding: Secondary | ICD-10-CM

## 2020-03-20 DIAGNOSIS — R1013 Epigastric pain: Secondary | ICD-10-CM

## 2020-03-20 MED ORDER — PANTOPRAZOLE SODIUM 20 MG PO TBEC: 20.0000 mg | DELAYED_RELEASE_TABLET | Freq: Two times a day (BID) | ORAL | 3 refills | Status: DC

## 2020-03-20 NOTE — Telephone Encounter (Signed)
Sent prescription to patient's pharmacy and informed patient that Pantoprazole 20mg  was sent.

## 2020-03-20 NOTE — Telephone Encounter (Signed)
Inbound call from patient requesting for Protonix medication dose be decreased to 20mg  instead of the 40mg  is possible.  States she has taken it before and feels that she tolerates the 20mg  better.  Please advise.

## 2020-03-20 NOTE — Telephone Encounter (Signed)
That is perfectly fine for her to try.  Thanks-JLL

## 2020-07-15 ENCOUNTER — Other Ambulatory Visit: Payer: Self-pay | Admitting: Internal Medicine

## 2020-08-07 IMAGING — MR MR SHOULDER*R* W/O CM
4 of 5 series · 19 of 40 positions shown · non-contrast
Comparison: None.

CLINICAL DATA: Right shoulder pain since the patient pulled herself
into a truck in September 2018.

EXAM:
MRI OF THE RIGHT SHOULDER WITHOUT CONTRAST
TECHNIQUE: Multiplanar, multisequence MR imaging of the shoulder was performed.
No intravenous contrast was administered.

[Series 6: PD fat-sat · axial · right · 4.0mm · 0.44mm/px · z∈[-45,+41]mm · 6 of 20 slices shown (1 of 2)]
[im 1/20]
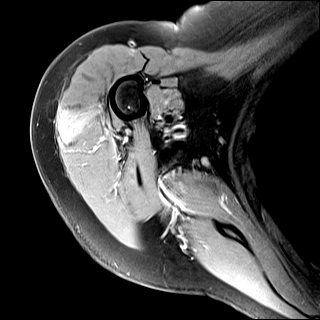
[im 4/20]
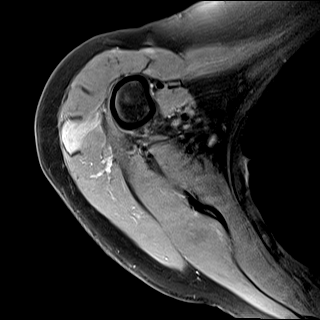
[im 8/20]
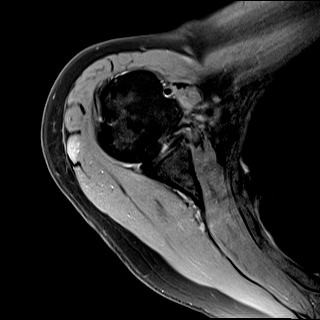
[im 12/20]
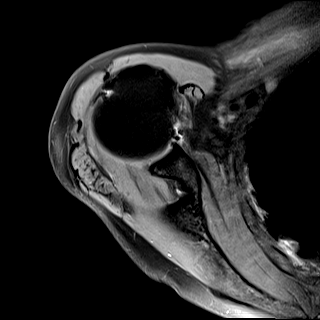
[im 16/20]
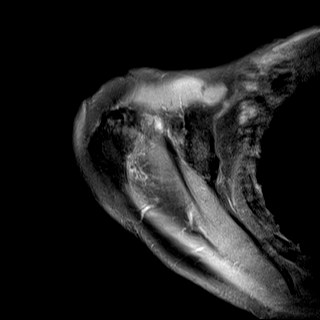
[im 20/20]
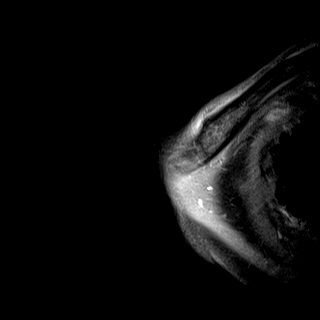

[Series 7: T2 fat-sat · oblique · right · 4.0mm · 0.22mm/px · 3 of 21 slices shown (1 of 2)]
[im 3/21]
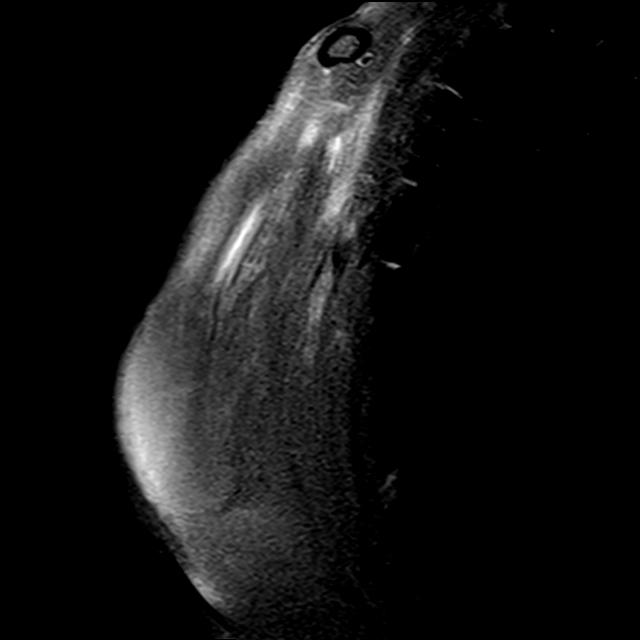
[im 12/21]
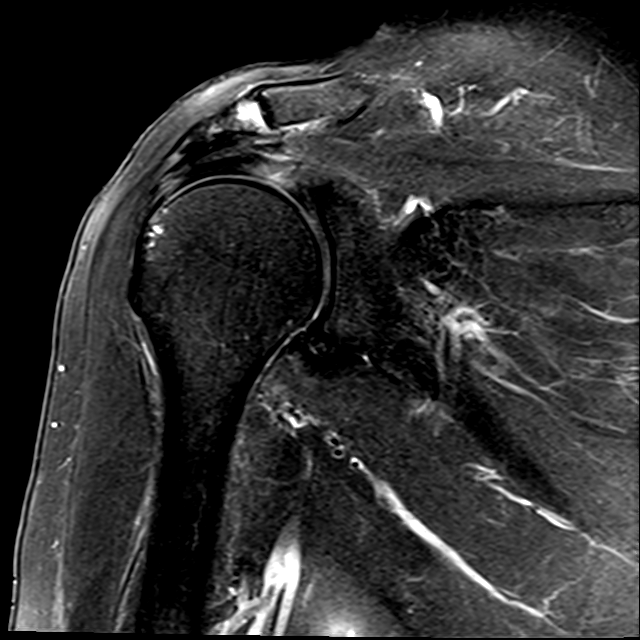
[im 18/21]
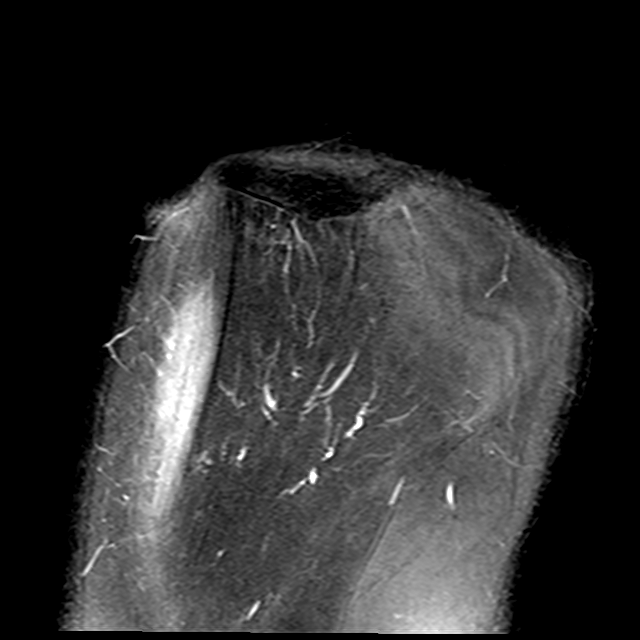

[Series 8: PD fat-sat · oblique · right · 4.0mm · 0.22mm/px · 7 of 21 slices shown (2 of 2)]
[im 1/21]
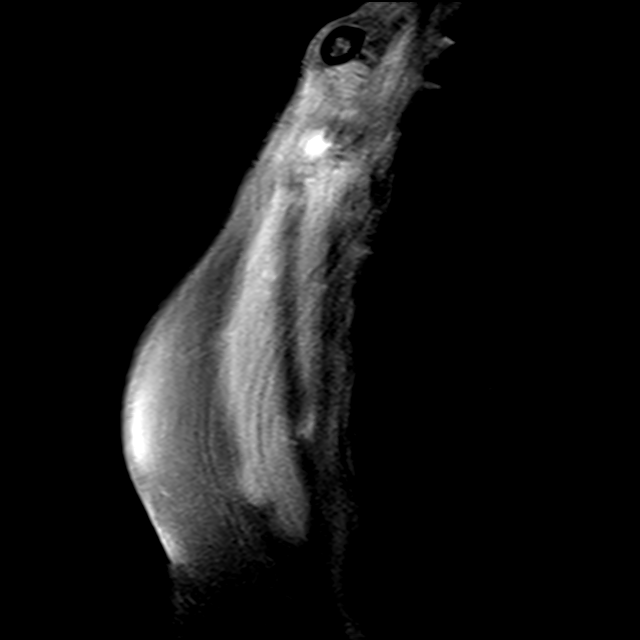
[im 3/21]
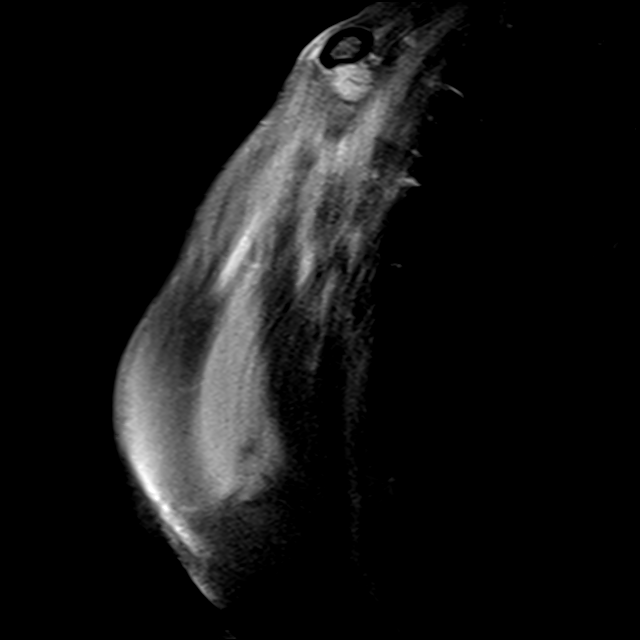
[im 6/21]
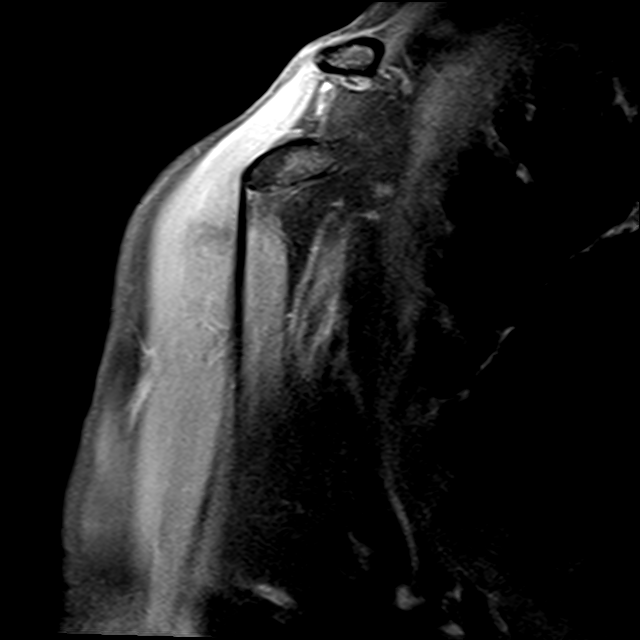
[im 9/21]
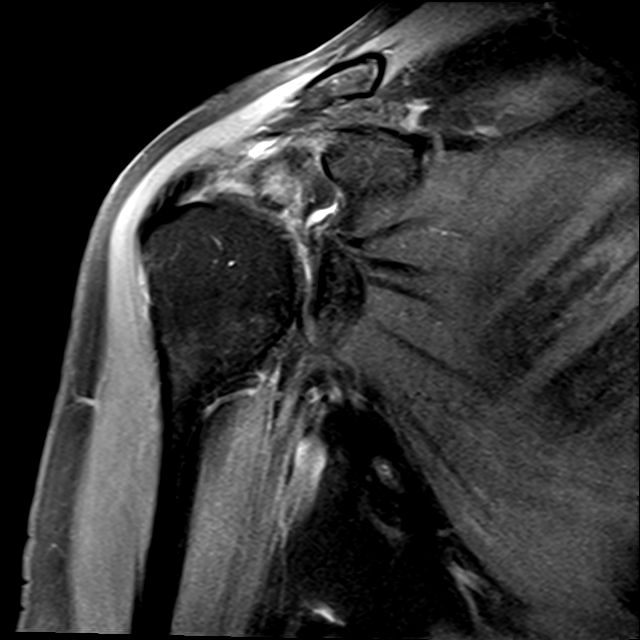
[im 12/21]
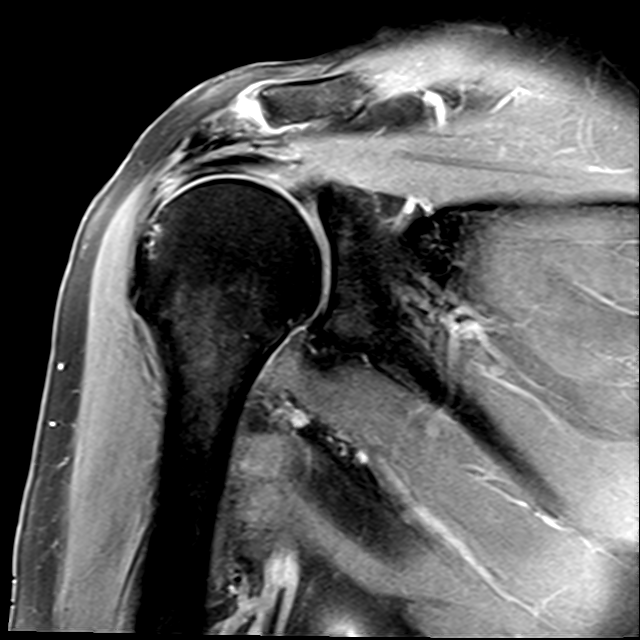
[im 15/21]
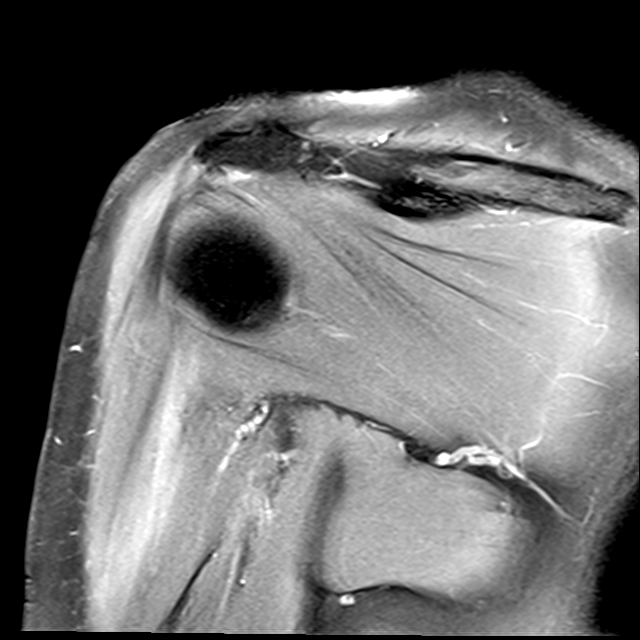
[im 18/21]
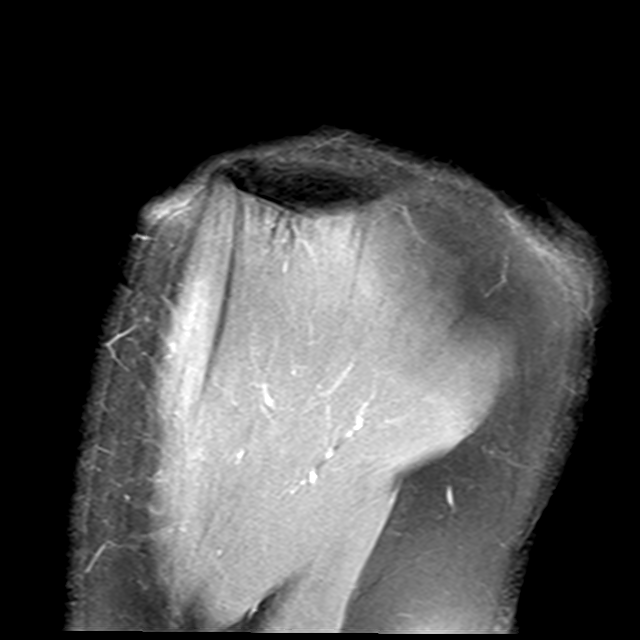

[Series 9: T2 fat-sat · coronal · right · 4.0mm · 0.44mm/px · 3 of 23 slices shown (2 of 2)]
[im 3/23]
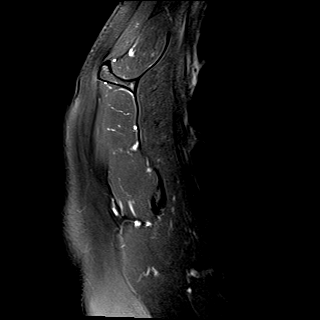
[im 12/23]
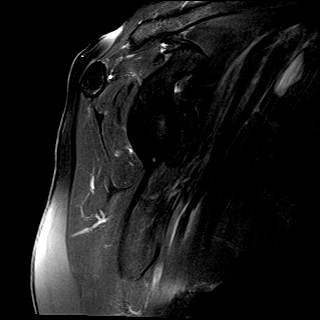
[im 20/23]
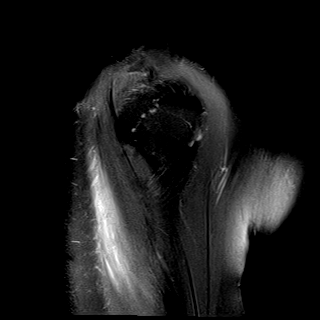

[19 of 40 positions shown; findings below may reference images not displayed]

FINDINGS: Rotator cuff: Intact. Mild-to-moderate supraspinatus and
infraspinatus tendinopathy noted.

Muscles:  Normal without atrophy or focal lesion.

Biceps long head:  Intact.

Acromioclavicular Joint: Mild osteoarthritis. Type 1 acromion. Small
subacromial spur is seen. Small volume of fluid is present in the
subacromial/subdeltoid bursa.

Glenohumeral Joint: Appears normal.

Labrum:  Intact.

Bones:  No fracture or focal lesion.

Other: There is a edema in the central to posterior aspect of the
deltoid. No frank tear is identified.
IMPRESSION: Mild-to-moderate supraspinatus and infraspinatus tendinopathy
without tear.

Mild acromioclavicular osteoarthritis.

Small volume of subacromial/subdeltoid fluid suggestive of bursitis.

Small area of edema in the mid to posterior deltoid muscle could be
due to strain, myositis or early atrophy. No tear.

## 2021-02-20 ENCOUNTER — Other Ambulatory Visit: Payer: Self-pay | Admitting: Internal Medicine

## 2021-10-19 ENCOUNTER — Encounter: Payer: Self-pay | Admitting: Podiatry

## 2021-10-19 ENCOUNTER — Ambulatory Visit (INDEPENDENT_AMBULATORY_CARE_PROVIDER_SITE_OTHER): Payer: Medicare Other | Admitting: Podiatry

## 2021-10-19 DIAGNOSIS — M810 Age-related osteoporosis without current pathological fracture: Secondary | ICD-10-CM | POA: Insufficient documentation

## 2021-10-19 DIAGNOSIS — R0989 Other specified symptoms and signs involving the circulatory and respiratory systems: Secondary | ICD-10-CM | POA: Insufficient documentation

## 2021-10-19 DIAGNOSIS — R630 Anorexia: Secondary | ICD-10-CM | POA: Insufficient documentation

## 2021-10-19 DIAGNOSIS — R002 Palpitations: Secondary | ICD-10-CM | POA: Insufficient documentation

## 2021-10-19 DIAGNOSIS — I471 Supraventricular tachycardia: Secondary | ICD-10-CM | POA: Insufficient documentation

## 2021-10-19 DIAGNOSIS — K589 Irritable bowel syndrome without diarrhea: Secondary | ICD-10-CM | POA: Insufficient documentation

## 2021-10-19 DIAGNOSIS — L603 Nail dystrophy: Secondary | ICD-10-CM

## 2021-10-19 DIAGNOSIS — E785 Hyperlipidemia, unspecified: Secondary | ICD-10-CM | POA: Insufficient documentation

## 2021-10-19 DIAGNOSIS — E559 Vitamin D deficiency, unspecified: Secondary | ICD-10-CM | POA: Insufficient documentation

## 2021-10-19 NOTE — Progress Notes (Signed)
Subjective:  Patient ID: Renee Richmond, female    DOB: 1955-03-17,  MRN: [id number] HPI Chief Complaint  Patient presents with   Toe Pain    Hallux left - thick, discolored nail x few weeks, had pedicure and after got really sore and red, soaked, better, but still tender   New Patient (Initial Visit)    66 y.o. female presents with the above complaint.   ROS: Denies fever chills nausea vomiting muscle aches pains calf pain back pain chest pain shortness of breath.  Past Medical History:  Diagnosis Date   Endometriosis    Fibromyalgia    IBS (irritable bowel syndrome)    RSD (reflex sympathetic dystrophy)    left hand   Thalassanemia    Past Surgical History:  Procedure Laterality Date   CHOLECYSTECTOMY     36 years ago   right rotator cuff repair     VAGINAL HYSTERECTOMY     25 years ago    Current Outpatient Medications:    Alum & Mag Hydroxide-Simeth (ANTACID LIQUID PO), Take by mouth., Disp: , Rfl:    saccharomyces boulardii (FLORASTOR) 250 MG capsule, Take 250 mg by mouth 2 (two) times daily., Disp: , Rfl:    Cholecalciferol (VITAMIN D3) 50 MCG (2000 UT) capsule, Take 2,000 Units by mouth daily., Disp: , Rfl:    estrogens, conjugated, (PREMARIN) 0.45 MG tablet, Take 0.45 mg by mouth daily. Take daily for 21 days then do not take for 7 days., Disp: , Rfl:    folic acid (FOLVITE) 1 MG tablet, Take 1 mg by mouth daily., Disp: , Rfl:    metoprolol tartrate (LOPRESSOR) 25 MG tablet, Take 12.5 mg by mouth 2 (two) times daily., Disp: , Rfl:    sucralfate (CARAFATE) 1 GM/10ML suspension, TAKE 10 MLS (1 G TOTAL) BY MOUTH 2 (TWO) TIMES DAILY, Disp: 420 mL, Rfl: 3   vitamin B-12 (CYANOCOBALAMIN) 1000 MCG tablet, Take 1,000 mcg by mouth daily., Disp: , Rfl:   Allergies  Allergen Reactions   Codeine Nausea And Vomiting   Darvon  [Propoxyphene] Nausea And Vomiting    DARVOCET. DARVOCET   Review of Systems Objective:  There were no vitals filed for this visit.  General:  Well developed, nourished, in no acute distress, alert and oriented x3   Dermatological: Skin is warm, dry and supple bilateral. Nails x 10 are well maintained; remaining integument appears unremarkable at this time. There are no open sores, no preulcerative lesions, no rash or signs of infection present.  Nail dystrophy hallux left most likely from trauma from having a pedicure done the nail is tender distally not proximally.  There is some discoloration of the proximal medial nail border.  Distal onycholysis is noted no signs of bacterial infection  Vascular: Dorsalis Pedis artery and Posterior Tibial artery pedal pulses are 2/4 bilateral with immedate capillary fill time. Pedal hair growth present. No varicosities and no lower extremity edema present bilateral.   Neruologic: Grossly intact via light touch bilateral. Vibratory intact via tuning fork bilateral. Protective threshold with Semmes Wienstein monofilament intact to all pedal sites bilateral. Patellar and Achilles deep tendon reflexes 2+ bilateral. No Babinski or clonus noted bilateral.   Musculoskeletal: No gross boney pedal deformities bilateral. No pain, crepitus, or limitation noted with foot and ankle range of motion bilateral. Muscular strength 5/5 in all groups tested bilateral.  Gait: Unassisted, Nonantalgic.    Radiographs:  None taken  Assessment & Plan:   Assessment: Nail dystrophy hallux left distal onycholysis  Plan: Samples of the skin and nail were taken today to be sent for pathologic evaluation follow-up with her in 1 month     Rocky Rishel T. [place], [place]

## 2021-11-23 ENCOUNTER — Ambulatory Visit (INDEPENDENT_AMBULATORY_CARE_PROVIDER_SITE_OTHER): Payer: Medicare Other | Admitting: Podiatry

## 2021-11-23 ENCOUNTER — Encounter: Payer: Self-pay | Admitting: Podiatry

## 2021-11-23 DIAGNOSIS — L603 Nail dystrophy: Secondary | ICD-10-CM

## 2021-11-23 NOTE — Progress Notes (Signed)
She presents today for her pathology results with her husband.  She also states that she is starting to develop an ingrown nail along the fibular border the hallux left.  Objective: Vital signs stable alert oriented x3 pathology results do demonstrate a saprophytic fungus a yeast and nail dystrophy.  She does have a mild paronychia along the fibular border of the hallux left and a thickening of the second nail plate left.  Assessment: Onychomycosis and mild paronychia.  Plan: I trimmed the margin of the nail today for her.  She will start laser therapy after thorough discussion of topical oral and laser today.  She has some severe GI issues which limit the drugs that she can take so we decided not to try any of the oral and I explained to her that most likely the topicals would not touch the yeast.

## 2021-12-31 ENCOUNTER — Ambulatory Visit (INDEPENDENT_AMBULATORY_CARE_PROVIDER_SITE_OTHER): Payer: Medicare Other

## 2021-12-31 DIAGNOSIS — L603 Nail dystrophy: Secondary | ICD-10-CM

## 2021-12-31 DIAGNOSIS — B351 Tinea unguium: Secondary | ICD-10-CM

## 2021-12-31 NOTE — Patient Instructions (Signed)

## 2021-12-31 NOTE — Progress Notes (Signed)
Patient presents today for the 1st laser treatment. Diagnosed with mycotic nail infection by Dr. Milinda Pointer.   Toenail most affected bilateral hallux.  All other systems are negative.  Nails were filed thin. Laser therapy was administered to 1-5 toenails bilateral and patient tolerated the treatment well. All safety precautions were in place.   Single laser pass was done on non-affected nails.   Follow up in 4 weeks for laser # 2.

## 2022-01-28 ENCOUNTER — Ambulatory Visit (INDEPENDENT_AMBULATORY_CARE_PROVIDER_SITE_OTHER): Payer: Medicare Other | Admitting: *Deleted

## 2022-01-28 DIAGNOSIS — L603 Nail dystrophy: Secondary | ICD-10-CM

## 2022-01-28 DIAGNOSIS — B351 Tinea unguium: Secondary | ICD-10-CM

## 2022-01-28 NOTE — Progress Notes (Signed)
Patient presents today for the 2nd  laser treatment. Diagnosed with mycotic nail infection by Dr. Al Corpus.   Toenail most affected hallux left. The lateral border of the nail is a little red.   All other systems are negative.  Nails were filed thin. Laser therapy was administered to 1-5 toenails bilateral and patient tolerated the treatment well. All safety precautions were in place.   Single laser pass was done on non-affected nails.   Follow up in 4 weeks for laser # 3.  Advised to schedule with Dr. Al Corpus week after Thanksgiving to have the ingrown checked. I did trim it out today, but if no better, keep appointment.

## 2022-02-08 ENCOUNTER — Encounter: Payer: Self-pay | Admitting: Podiatry

## 2022-02-08 ENCOUNTER — Ambulatory Visit (INDEPENDENT_AMBULATORY_CARE_PROVIDER_SITE_OTHER): Payer: Medicare Other | Admitting: Podiatry

## 2022-02-08 DIAGNOSIS — L03032 Cellulitis of left toe: Secondary | ICD-10-CM

## 2022-02-08 NOTE — Progress Notes (Signed)
She presents today after having seen Morrie Sheldon for her laser therapy.  When she saw Morrie Sheldon there was a small abscess that was noted on the lateral border of the hallux left which actually lanced and cleaned up.  She states that is doing much better and there is no problems at this point.  Objective: Vital signs stable alert and oriented x 3.  There is no erythema edema cellulitis drainage or odor.  Assessment: Well-healing paronychia.  Continue laser therapy

## 2022-02-25 ENCOUNTER — Other Ambulatory Visit: Payer: Medicare Other

## 2022-03-29 ENCOUNTER — Ambulatory Visit (INDEPENDENT_AMBULATORY_CARE_PROVIDER_SITE_OTHER): Payer: Medicare Other

## 2022-03-29 DIAGNOSIS — L603 Nail dystrophy: Secondary | ICD-10-CM | POA: Insufficient documentation

## 2022-03-29 NOTE — Progress Notes (Signed)
Patient presents today for the 3rd laser treatment. Diagnosed with mycotic nail infection by Dr. Milinda Pointer.   Toenail most affected hallux left. The lateral border of the nail is a little red.   All other systems are negative.  Nails were filed thin. Laser therapy was administered to 1-5 toenails bilateral and patient tolerated the treatment well. All safety precautions were in place.   Single laser pass was done on non-affected nails.   Follow up in 6 weeks for laser # 4.

## 2022-04-26 ENCOUNTER — Ambulatory Visit (INDEPENDENT_AMBULATORY_CARE_PROVIDER_SITE_OTHER): Payer: Medicare Other

## 2022-04-26 DIAGNOSIS — L603 Nail dystrophy: Secondary | ICD-10-CM

## 2022-04-26 NOTE — Progress Notes (Signed)
Patient presents today for the 4th laser treatment. Diagnosed with mycotic nail infection by Dr. Milinda Pointer.   Toenail most affected hallux left. The lateral border of the nail is a little red.   All other systems are negative.  Nails were filed thin. Laser therapy was administered to 1-5 toenails bilateral and patient tolerated the treatment well. All safety precautions were in place.   Single laser pass was done on non-affected nails.   Follow up in 4 weeks for laser # 5

## 2022-05-24 ENCOUNTER — Ambulatory Visit: Payer: Medicare Other

## 2022-05-24 DIAGNOSIS — L603 Nail dystrophy: Secondary | ICD-10-CM

## 2022-05-24 NOTE — Progress Notes (Signed)
Patient presents today for the 5th laser treatment. Diagnosed with mycotic nail infection by Dr. Milinda Pointer.   Toenail most affected hallux left. The lateral border of the nail is a little red.   All other systems are negative.  Nails were filed thin. Laser therapy was administered to 1-5 toenails bilateral and patient tolerated the treatment well. All safety precautions were in place.   Single laser pass was done on non-affected nails.   Follow up in 8 weeks for laser # 6

## 2022-06-17 ENCOUNTER — Other Ambulatory Visit: Payer: Medicare Other

## 2022-06-17 ENCOUNTER — Ambulatory Visit (INDEPENDENT_AMBULATORY_CARE_PROVIDER_SITE_OTHER): Payer: Medicare Other | Admitting: *Deleted

## 2022-06-17 DIAGNOSIS — L603 Nail dystrophy: Secondary | ICD-10-CM

## 2022-06-17 NOTE — Progress Notes (Signed)
Patient presents today for the 6th laser treatment. Diagnosed with mycotic nail infection by Dr. Al Corpus.   Toenail most affected hallux left. The nails are looking much better and she is happy with the progress so far.   All other systems are negative.  Nails were filed thin. Laser therapy was administered to 1-5 toenails bilateral and patient tolerated the treatment well. All safety precautions were in place.   Single laser pass was done on non-affected nails.   Patient has completed the recommended laser treatments. He will follow up with Dr. Al Corpus in 3 months to evaluate progress.

## 2022-06-21 ENCOUNTER — Other Ambulatory Visit: Payer: Medicare Other

## 2022-09-29 ENCOUNTER — Ambulatory Visit: Payer: Medicare Other | Admitting: Podiatry

## 2024-01-09 ENCOUNTER — Ambulatory Visit: Admitting: Podiatry

## 2024-01-09 ENCOUNTER — Encounter: Payer: Self-pay | Admitting: Podiatry

## 2024-01-09 DIAGNOSIS — M19071 Primary osteoarthritis, right ankle and foot: Secondary | ICD-10-CM

## 2024-01-09 DIAGNOSIS — M1909 Primary osteoarthritis, other specified site: Secondary | ICD-10-CM | POA: Diagnosis not present

## 2024-01-09 DIAGNOSIS — M19072 Primary osteoarthritis, left ankle and foot: Secondary | ICD-10-CM

## 2024-01-10 LAB — URIC ACID: Uric Acid, Serum: 4 mg/dL (ref 2.5–7.0)

## 2024-01-10 LAB — CBC WITH DIFFERENTIAL/PLATELET
Absolute Lymphocytes: 2094 {cells}/uL (ref 850–3900)
Absolute Monocytes: 792 {cells}/uL (ref 200–950)
Basophils Absolute: 97 {cells}/uL (ref 0–200)
Basophils Relative: 1.1 %
Eosinophils Absolute: 202 {cells}/uL (ref 15–500)
Eosinophils Relative: 2.3 %
HCT: 35 % (ref 35.0–45.0)
Hemoglobin: 10.8 g/dL — ABNORMAL LOW (ref 11.7–15.5)
MCH: 20.1 pg — ABNORMAL LOW (ref 27.0–33.0)
MCHC: 30.9 g/dL — ABNORMAL LOW (ref 32.0–36.0)
MCV: 65.1 fL — ABNORMAL LOW (ref 80.0–100.0)
MPV: 9.8 fL (ref 7.5–12.5)
Monocytes Relative: 9 %
Neutro Abs: 5614 {cells}/uL (ref 1500–7800)
Neutrophils Relative %: 63.8 %
Platelets: 410 Thousand/uL — ABNORMAL HIGH (ref 140–400)
RBC: 5.38 Million/uL — ABNORMAL HIGH (ref 3.80–5.10)
RDW: 16 % — ABNORMAL HIGH (ref 11.0–15.0)
Total Lymphocyte: 23.8 %
WBC: 8.8 Thousand/uL (ref 3.8–10.8)

## 2024-01-10 LAB — SEDIMENTATION RATE: Sed Rate: 11 mm/h (ref 0–30)

## 2024-01-10 LAB — ANA: Anti Nuclear Antibody (ANA): NEGATIVE

## 2024-01-10 LAB — C-REACTIVE PROTEIN: CRP: 8.8 mg/L — ABNORMAL HIGH (ref <8.0)

## 2024-01-10 LAB — RHEUMATOID FACTOR: Rheumatoid fact SerPl-aCnc: <10 [IU]/mL (ref <14)

## 2024-01-10 NOTE — Progress Notes (Signed)
 She presents today chief concern of tenderness and a funny feeling to the plantar foot.  She is also getting burning on the tops of her toes and top of her feet she states that her primary care provider did regular blood work and everything was good.  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  She states that recently she says everything has been going downhill she says she is diagnosed that she had warts on the tips of her fingers and the thenar eminence of her left hand.  She just does not know what what all could be going on but she says she just does not feel well.  Objective: Vital signs are stable alert and oriented x 3.  Pulses are palpable.  Neurologic sensorium is intact.  Deep tendon reflexes normal muscle strength is normal symmetrical.  Discoloration of her nail plates.  She has some thickening of the nail plate hallux and second digit left.  Assessment nail dystrophy cannot rule out onychomycosis.  I do think that she probably needs a rheumatic workup we will do that today ordering a arthritic panel to see if there is any CBC change and ANA.  Plan: Blood work ordered today also prescribed Penlac for the toenails we will notify her as to the results of the blood work.  I did not prescribe any medication for the no burning feeling.

## 2024-01-15 ENCOUNTER — Telehealth: Payer: Self-pay | Admitting: Podiatry

## 2024-01-15 NOTE — Telephone Encounter (Signed)
 Patient called about her prescription for Penlac. She said she called her pharmacy and they haven't received the prescription. Her preferred pharmacy is the [facility] in [place], [place].

## 2024-01-16 ENCOUNTER — Other Ambulatory Visit: Payer: Self-pay

## 2024-01-16 MED ORDER — CICLOPIROX 8 % EX SOLN: Freq: Every day | CUTANEOUS | 0 refills | Status: AC

## 2024-01-17 ENCOUNTER — Telehealth: Payer: Self-pay | Admitting: Lab

## 2024-01-17 NOTE — Telephone Encounter (Signed)
 Patient is calling inquiring about lab results would like feedback and results. Patient is aware Dr.Hyatt is out the office. Please review and advise.

## 2024-01-30 ENCOUNTER — Ambulatory Visit: Payer: Self-pay | Admitting: Podiatry

## 2024-02-20 ENCOUNTER — Encounter: Payer: Self-pay | Admitting: Podiatry

## 2024-02-20 ENCOUNTER — Ambulatory Visit: Admitting: Podiatry

## 2024-02-20 DIAGNOSIS — M19072 Primary osteoarthritis, left ankle and foot: Secondary | ICD-10-CM

## 2024-02-20 NOTE — Progress Notes (Signed)
 She presents with her husband today for follow-up of her neuropathy.  She states that she has noticed that increasing her B12 has made her neuropathy feel some improvement.  She states that she continues to paint her nails on a regular basis with Penlac  but has not noticed an appreciable change.  Objective: Vital signs are stable alert and oriented x 3.  Pulses are palpable.  She has no change in physical exam.  We did discuss in detail her thalassemia minor as well as the fact that a low hemoglobin will cause nerve pain primarily due to the lack of oxygen.  This may be a reason why B12 has been helping to some degree.  Assessment: Nail dystrophy.  Neuropathy.  Plan: Recommend she continue Penlac  for the next 10 months.  I recommend she follow-up with her primary for folic acid/B12 study and consider monthly injections as well as daily supplements.
